# Patient Record
Sex: Male | Born: 1985 | Race: White | Hispanic: No | Marital: Single | Smoking: Current every day smoker
Health system: Southern US, Community
[De-identification: ages and names within clinical notes are randomized; demographics above are authoritative.]

## PROBLEM LIST (undated history)

## (undated) DIAGNOSIS — E079 Disorder of thyroid, unspecified: Secondary | ICD-10-CM

---

## 2009-07-20 ENCOUNTER — Emergency Department (HOSPITAL_COMMUNITY): Admission: EM | Admit: 2009-07-20 | Discharge: 2009-07-20 | Payer: Self-pay | Admitting: Emergency Medicine
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
## 2020-10-22 ENCOUNTER — Other Ambulatory Visit: Payer: Self-pay

## 2020-10-22 ENCOUNTER — Emergency Department (HOSPITAL_BASED_OUTPATIENT_CLINIC_OR_DEPARTMENT_OTHER)
Admission: EM | Admit: 2020-10-22 | Discharge: 2020-10-23 | Disposition: A | Discharge status: Home / Self Care | Payer: Self-pay | Attending: Emergency Medicine | Admitting: Emergency Medicine

## 2020-10-22 ENCOUNTER — Emergency Department (HOSPITAL_BASED_OUTPATIENT_CLINIC_OR_DEPARTMENT_OTHER): Payer: Self-pay

## 2020-10-22 ENCOUNTER — Encounter (HOSPITAL_BASED_OUTPATIENT_CLINIC_OR_DEPARTMENT_OTHER): Payer: Self-pay

## 2020-10-22 DIAGNOSIS — S60221A Contusion of right hand, initial encounter: Secondary | ICD-10-CM | POA: Insufficient documentation

## 2020-10-22 DIAGNOSIS — F17210 Nicotine dependence, cigarettes, uncomplicated: Secondary | ICD-10-CM | POA: Insufficient documentation

## 2020-10-22 DIAGNOSIS — W208XXA Other cause of strike by thrown, projected or falling object, initial encounter: Secondary | ICD-10-CM | POA: Insufficient documentation

## 2020-10-22 DIAGNOSIS — Y990 Civilian activity done for income or pay: Secondary | ICD-10-CM | POA: Insufficient documentation

## 2020-10-22 HISTORY — DX: Disorder of thyroid, unspecified: E07.9

## 2020-10-22 NOTE — ED Triage Notes (Signed)
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

## 2020-10-22 NOTE — ED Provider Notes (Signed)
[facility] EMERGENCY DEPARTMENT Provider Note   CSN: [id number] Arrival date & time: 10/22/20  2106     History Chief Complaint  Patient presents with  . Hand Injury    Christopher Mahoney is a 35 y.o. male.   Hand Injury Location:  Hand Hand location:  R hand Injury: yes   Time since incident:  1 week Mechanism of injury: crush   Crush injury:    Mechanism:  Falling object Pain details:    Quality:  Aching and sharp   Radiates to:  Does not radiate   Severity:  Mild   Onset quality:  Sudden   Duration:  1 week   Timing:  Constant   Progression:  Waxing and waning Dislocation: no   Foreign body present:  No foreign bodies Tetanus status:  Up to date Prior injury to area:  No Relieved by:  None tried      Past Medical History:  Diagnosis Date  . Thyroid disease     There are no problems to display for this patient.   History reviewed. No pertinent surgical history.     No family history on file.  Social History   Tobacco Use  . Smoking status: Current Every Day Smoker    Types: Cigarettes  . Smokeless tobacco: Never Used  Substance Use Topics  . Alcohol use: Never  . Drug use: Yes    Types: Marijuana    Home Medications Prior to Admission medications   Medication Sig Start Date End Date Taking? Authorizing Provider  oxyCODONE-acetaminophen (PERCOCET) 5-325 MG tablet Take 1 tablet by mouth every 4 (four) hours as needed. 10/23/20  Yes Lennex Pietila, Barbara Cower, MD    Allergies    Patient has no known allergies.  Review of Systems   Review of Systems  All other systems reviewed and are negative.   Physical Exam Updated Vital Signs BP (!) 144/90 (BP Location: Left Arm)   Pulse 64   Temp 98.6 F (37 C) (Oral)   Resp 14   Ht 5\' 6"  (1.676 m)   Wt 69.9 kg   SpO2 100%   BMI 24.86 kg/m   Physical Exam Vitals and nursing note reviewed.  Constitutional:      Appearance: He is well-developed.  HENT:     Head: Normocephalic and atraumatic.      Nose: Nose normal. No congestion or rhinorrhea.     Mouth/Throat:     Mouth: Mucous membranes are moist.     Pharynx: Oropharynx is clear.  Cardiovascular:     Rate and Rhythm: Normal rate.  Pulmonary:     Effort: Pulmonary effort is normal. No respiratory distress.  Abdominal:     General: Abdomen is flat. There is no distension.  Musculoskeletal:        General: Swelling (dorsum of right hand) present. Normal range of motion.     Cervical back: Normal range of motion.  Skin:    General: Skin is warm and dry.     Coloration: Skin is not jaundiced or pale.  Neurological:     General: No focal deficit present.     Mental Status: He is alert.     ED Results / Procedures / Treatments   Labs (all labs ordered are listed, but only abnormal results are displayed) Labs Reviewed - No data to display  EKG None  Radiology DG Hand Complete Right  Result Date: 10/22/2020 CLINICAL DATA:  Status post trauma 1 week ago. EXAM: RIGHT HAND -  COMPLETE 3+ VIEW COMPARISON:  None. FINDINGS: There is no evidence of fracture or dislocation. There is no evidence of arthropathy or other focal bone abnormality. Moderate severity dorsal soft tissue swelling is seen. IMPRESSION: Dorsal soft tissue swelling without an acute osseous abnormality. Electronically Signed   By: Aram Candela M.D.   On: 10/22/2020 21:45    Procedures Procedures   Medications Ordered in ED Medications  oxyCODONE-acetaminophen (PERCOCET/ROXICET) 5-325 MG per tablet 2 tablet (has no administration in time range)    ED Course  I have reviewed the triage vital signs and the nursing notes.  Pertinent labs & imaging results that were available during my care of the patient were reviewed by me and considered in my medical decision making (see chart for details).    MDM Rules/Calculators/A&P                          Ace wrap. analgesia. Hand follow up if not improving.   Final Clinical Impression(s) / ED  Diagnoses Final diagnoses:  Contusion of right hand, initial encounter    Rx / DC Orders ED Discharge Orders         Ordered    oxyCODONE-acetaminophen (PERCOCET) 5-325 MG tablet  Every 4 hours PRN        10/23/20 0014           Marissa Weaver, Barbara Cower, MD 10/23/20 [id number]

## 2020-10-22 NOTE — ED Notes (Signed)
Pt given a ice pack for rt hand 

## 2020-10-23 MED ORDER — OXYCODONE-ACETAMINOPHEN 5-325 MG PO TABS
2.0000 | ORAL_TABLET | Freq: Once | ORAL | Status: AC
  Administered 2020-10-23: 2 via ORAL
  Filled 2020-10-23: qty 2

## 2020-10-23 MED ORDER — OXYCODONE-ACETAMINOPHEN 5-325 MG PO TABS: 1.0000 | ORAL_TABLET | ORAL | 0 refills | Status: AC | PRN
